# Patient Record
Sex: Female | Born: 1980 | Race: White | Hispanic: No | Marital: Married | Smoking: Never smoker
Health system: Southern US, Community
[De-identification: ages and names within clinical notes are randomized; demographics above are authoritative.]

## PROBLEM LIST (undated history)

## (undated) DIAGNOSIS — K219 Gastro-esophageal reflux disease without esophagitis: Secondary | ICD-10-CM

## (undated) DIAGNOSIS — T7840XA Allergy, unspecified, initial encounter: Secondary | ICD-10-CM

## (undated) DIAGNOSIS — G709 Myoneural disorder, unspecified: Secondary | ICD-10-CM

## (undated) HISTORY — DX: Gastro-esophageal reflux disease without esophagitis: K21.9

## (undated) HISTORY — DX: Myoneural disorder, unspecified: G70.9

## (undated) HISTORY — DX: Allergy, unspecified, initial encounter: T78.40XA

---

## 2013-10-09 HISTORY — PX: NASAL SINUS SURGERY: SHX719

## 2013-10-09 HISTORY — PX: FOOT SURGERY: SHX648

## 2016-02-02 ENCOUNTER — Encounter (HOSPITAL_BASED_OUTPATIENT_CLINIC_OR_DEPARTMENT_OTHER): Payer: Self-pay | Admitting: *Deleted

## 2016-02-02 DIAGNOSIS — Y929 Unspecified place or not applicable: Secondary | ICD-10-CM | POA: Insufficient documentation

## 2016-02-02 DIAGNOSIS — S46912A Strain of unspecified muscle, fascia and tendon at shoulder and upper arm level, left arm, initial encounter: Secondary | ICD-10-CM | POA: Diagnosis not present

## 2016-02-02 DIAGNOSIS — S199XXA Unspecified injury of neck, initial encounter: Secondary | ICD-10-CM | POA: Diagnosis present

## 2016-02-02 DIAGNOSIS — S39012A Strain of muscle, fascia and tendon of lower back, initial encounter: Secondary | ICD-10-CM | POA: Diagnosis not present

## 2016-02-02 DIAGNOSIS — Y999 Unspecified external cause status: Secondary | ICD-10-CM | POA: Insufficient documentation

## 2016-02-02 DIAGNOSIS — S161XXA Strain of muscle, fascia and tendon at neck level, initial encounter: Secondary | ICD-10-CM | POA: Diagnosis not present

## 2016-02-02 DIAGNOSIS — Y939 Activity, unspecified: Secondary | ICD-10-CM | POA: Diagnosis not present
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
## 2016-02-03 ENCOUNTER — Emergency Department (HOSPITAL_BASED_OUTPATIENT_CLINIC_OR_DEPARTMENT_OTHER)
Admission: EM | Admit: 2016-02-03 | Discharge: 2016-02-03 | Disposition: A | Discharge status: Home / Self Care | Payer: Managed Care, Other (non HMO) | Attending: Emergency Medicine | Admitting: Emergency Medicine

## 2016-02-03 DIAGNOSIS — T148XXA Other injury of unspecified body region, initial encounter: Secondary | ICD-10-CM

## 2016-02-03 MED ORDER — KETOROLAC TROMETHAMINE 10 MG PO TABS
10.0000 mg | ORAL_TABLET | Freq: Once | ORAL | Status: DC
  Filled 2016-02-03: qty 1

## 2016-02-03 MED ORDER — DICLOFENAC SODIUM 75 MG PO TBEC: 75.0000 mg | DELAYED_RELEASE_TABLET | Freq: Two times a day (BID) | ORAL | Status: AC

## 2016-02-03 MED ORDER — METHOCARBAMOL 500 MG PO TABS: 500.0000 mg | ORAL_TABLET | Freq: Three times a day (TID) | ORAL | Status: DC

## 2016-02-03 MED ORDER — TRAMADOL HCL 50 MG PO TABS: ORAL_TABLET | ORAL | Status: AC

## 2016-02-03 MED ORDER — ONDANSETRON HCL 8 MG PO TABS
4.0000 mg | ORAL_TABLET | Freq: Once | ORAL | Status: DC
  Filled 2016-02-03: qty 1

## 2016-02-03 MED ORDER — HYDROCODONE-ACETAMINOPHEN 5-325 MG PO TABS
2.0000 | ORAL_TABLET | Freq: Once | ORAL | Status: DC
  Filled 2016-02-03: qty 2

## 2016-02-03 MED ORDER — METHOCARBAMOL 500 MG PO TABS: 500.0000 mg | ORAL_TABLET | Freq: Three times a day (TID) | ORAL | Status: AC

## 2016-02-03 MED ORDER — CYCLOBENZAPRINE HCL 10 MG PO TABS
5.0000 mg | ORAL_TABLET | Freq: Once | ORAL | Status: DC
  Filled 2016-02-03: qty 1

## 2016-02-03 MED ORDER — HYDROCODONE-ACETAMINOPHEN 5-325 MG PO TABS: ORAL_TABLET | ORAL | Status: DC
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
## 2016-02-09 ENCOUNTER — Ambulatory Visit (INDEPENDENT_AMBULATORY_CARE_PROVIDER_SITE_OTHER): Payer: Managed Care, Other (non HMO) | Admitting: Family Medicine

## 2016-02-09 ENCOUNTER — Encounter: Payer: Self-pay | Admitting: Family Medicine

## 2016-02-09 ENCOUNTER — Ambulatory Visit (HOSPITAL_BASED_OUTPATIENT_CLINIC_OR_DEPARTMENT_OTHER)
Admission: RE | Admit: 2016-02-09 | Discharge: 2016-02-09 | Disposition: A | Discharge status: Home / Self Care | Payer: Managed Care, Other (non HMO) | Source: Ambulatory Visit | Attending: Family Medicine | Admitting: Family Medicine

## 2016-02-09 VITALS — BP 120/65 | HR 61 | Temp 98.4°F

## 2016-02-09 DIAGNOSIS — Z87898 Personal history of other specified conditions: Secondary | ICD-10-CM | POA: Diagnosis not present

## 2016-02-09 DIAGNOSIS — R079 Chest pain, unspecified: Secondary | ICD-10-CM | POA: Diagnosis not present

## 2016-02-09 DIAGNOSIS — Z9189 Other specified personal risk factors, not elsewhere classified: Secondary | ICD-10-CM

## 2016-02-09 DIAGNOSIS — R202 Paresthesia of skin: Principal | ICD-10-CM

## 2016-02-09 DIAGNOSIS — M4185 Other forms of scoliosis, thoracolumbar region: Secondary | ICD-10-CM | POA: Insufficient documentation

## 2016-02-09 DIAGNOSIS — R0789 Other chest pain: Secondary | ICD-10-CM

## 2016-02-09 DIAGNOSIS — M542 Cervicalgia: Secondary | ICD-10-CM

## 2016-02-09 DIAGNOSIS — R200 Anesthesia of skin: Secondary | ICD-10-CM | POA: Diagnosis not present

## 2016-02-09 NOTE — Progress Notes (Signed)
Pre visit review using our clinic tool,if applicable. No additional management support is needed unless otherwise documented below in the visit note.  

## 2016-02-09 NOTE — Patient Instructions (Addendum)
We will get some x-rays for you today- please stay on the premises until I have your results!   You may certainly try some tyelnol if you like for pain  I will refer you to an OB/GYN to establish care and also to look into any fertility concerns.  I would also recommend that you husband see urology to make sure that his sperm counts are good.   Your films look good- let me know if your right arm symptoms do not resolve in the next 1-2 weeks. In that case we might consider using an oral steroid for you
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
## 2016-02-11 ENCOUNTER — Encounter: Payer: Self-pay | Admitting: Family Medicine

## 2016-03-02 ENCOUNTER — Encounter: Payer: Managed Care, Other (non HMO) | Admitting: Obstetrics & Gynecology

## 2016-03-02 ENCOUNTER — Encounter: Payer: Self-pay | Admitting: Obstetrics & Gynecology

## 2016-03-02 ENCOUNTER — Ambulatory Visit (INDEPENDENT_AMBULATORY_CARE_PROVIDER_SITE_OTHER): Payer: Managed Care, Other (non HMO) | Admitting: Obstetrics & Gynecology

## 2016-03-02 VITALS — BP 121/77 | HR 73 | Ht 68.0 in | Wt 172.0 lb

## 2016-03-02 DIAGNOSIS — Z1151 Encounter for screening for human papillomavirus (HPV): Secondary | ICD-10-CM

## 2016-03-02 DIAGNOSIS — Z124 Encounter for screening for malignant neoplasm of cervix: Secondary | ICD-10-CM

## 2016-03-02 DIAGNOSIS — N979 Female infertility, unspecified: Secondary | ICD-10-CM

## 2016-03-02 DIAGNOSIS — Z01419 Encounter for gynecological examination (general) (routine) without abnormal findings: Secondary | ICD-10-CM

## 2016-03-02 LAB — TSH: TSH: 1.33 mIU/L

## 2016-03-02 LAB — FOLLICLE STIMULATING HORMONE: FSH: 4.9 m[IU]/mL

## 2016-03-02 LAB — ESTRADIOL: ESTRADIOL: 97 pg/mL
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
## 2016-03-02 NOTE — Patient Instructions (Signed)
Infertility Infertility is when you are unable to get pregnant (conceive) after a year of having sex regularly without using birth control. Infertility can also mean that a woman is not able to carry a pregnancy to full term.  Both women and men can have fertility problems. WHAT CAUSES INFERTILITY? What Causes Infertility in Women? There are many possible causes of infertility in women. For some women, the cause of infertility is not known (unexplained infertility). Infertility can also be linked to more than one cause. Infertility problems in women can be caused by problems with the menstrual cycle or reproductive organs, certain medical conditions, and factors related to lifestyle and age.  Problems with your menstrual cycle can interfere with your ovaries producing eggs (ovulation). This can make it difficult to get pregnant. This includes having a menstrual cycle that is very long, very short, or irregular.  Problems with reproductive organs can include:  An abnormally narrow cervix or a cervix that does not remain closed during a pregnancy.  A blockage in your fallopian tubes.  An abnormally shaped uterus.  Uterine fibroids. This is a tissue mass (tumor) that can develop on your uterus.  Medical conditions that can affect a woman's fertility include:  Polycystic ovarian syndrome (PCOS). This is a hormonal disorder that can cause small cysts to grow on your ovaries. This is the most common cause of infertility in women.  Endometriosis. This is a condition in which the tissue that lines your uterus (endometrium) grows outside of its normal location.  Primary ovary insufficiency. This is when your ovaries stop producing eggs and hormones before the age of 40.  Sexually transmitted diseases, such as chlamydia or gonorrhea. These infections can cause scarring in your fallopian tubes. This makes it difficult for eggs to reach your uterus.  Autoimmune disorders. These are disorders in  which your immune system attacks normal, healthy cells.  Hormone imbalances.  Other factors include:  Age. A woman's fertility declines with age, especially after her mid-30s.  Being under- or overweight.  Drinking too much alcohol.  Using drugs.  Exercising excessively.  Being exposed to environmental toxins, such as radiation, pesticides, and certain chemicals. What Causes Infertility in Men? There are many causes of infertility in men. Infertility can be linked to more than one cause. Infertility problems in men can be caused by problems with sperm or the reproductive organs, certain medical conditions, and factors related to lifestyle and age. Some men have unexplained infertility.   Problems with sperm. Infertility can result if there is a problem producing:  Enough sperm (low sperm count).  Enough normally-shaped sperm (sperm morphology).  Sperm that are able to reach the egg (poor motility).  Infertility can also be caused by:  A problem with hormones.  Enlarged veins (varicoceles), cysts (spermatoceles), or tumors of the testicles.  Sexual dysfunction.  Injury to the testicles.  A birth defect, such as not having the tubes that carry sperm (vas deferens).  Medical conditions that can affect a man's fertility include:  Diabetes.  Cancer treatments, such as chemotherapy or radiation.  Klinefelter syndrome. This is an inherited genetic disorder.   Thyroid problems, such as an under- or overactive thyroid.  Cystic fibrosis.  Sexually transmitted diseases.  Other factors include:  Age. A man's fertility declines with age.  Drinking too much alcohol.  Using drugs.  Being exposed to environmental toxins, such as pesticides and lead. WHAT ARE THE SYMPTOMS OF INFERTILITY? Being unable to get pregnant after one year of having   regular sex without using birth control is the only sign of infertility.  HOW IS INFERTILITY DIAGNOSED? In order to be diagnosed  with infertility, both partners will have a physical exam. Both partners will also have an extensive medical and sexual history taken. If there is no obvious reason for infertility, additional tests may be done. What Tests Will Women Have? Women may first have tests to check whether they are ovulating each month. The tests may include:  Blood tests to check hormone levels.  An ultrasound of the ovaries. This looks for possible problems on or in the ovaries.  Taking a small sample of the tissue that lines the uterus for examination under a microscope (endometrial biopsy). Women who are ovulating may have additional tests. These may include:  Hysterosalpingography.  This is an X-ray of the fallopian tubes and uterus taken after a specific type of dye is injected.  This test can show the shape of the uterus and whether the fallopian tubes are open.  Laparoscopy.  In this test, a lighted tube (laparoscope) is used to look for problems in the fallopian tubes and other female organs.  Transvaginal ultrasound.  This is an imaging test to check for abnormalities of the uterus and ovaries.  A health care provider can use this test to count the number of follicles on the ovaries.  Hysteroscopy.  This test involves using a lighted tube to examine the cervix and inside the uterus.  It is done to find any abnormalities inside the uterus. What Tests Will Men Have? Tests for men's infertility includes:  Semen tests to check sperm count, morphology, and motility.  Blood tests to check for hormone levels.  Taking a small sample of tissue from inside a testicle (biopsy). This is examined under a microscope.  Blood tests to check for genetic abnormalities (genetic testing). HOW ARE WOMEN TREATED FOR INFERTILITY?  Treatment depends on the cause of infertility. Most cases of infertility in women are treated with medicine or surgery.  Women may take medicine to:  Correct ovulation  problems.  Treat other health conditions, such as PCOS.  Surgery may be done to:  Repair damage to the ovaries, fallopian tubes, cervix, or uterus.  Remove growths from the uterus.  Remove scar tissue from the uterus, pelvis, or other female organs. HOW ARE MEN TREATED FOR INFERTILITY?  Treatment depends on the cause of infertility. Most cases of infertility in men are treated with medicine or surgery.   Men may take medicine to:  Correct hormone problems.  Treat other health conditions.  Treat sexual dysfunction.  Surgery may be done to:  Remove blockages in the reproductive tract.  Correct other structural problems of the reproductive tract. WHAT IS ASSISTED REPRODUCTIVE TECHNOLOGY? Assisted reproductive technology (ART) refers to all treatments and procedures that combine eggs and sperm outside the body to try to help a couple conceive. ART is often combined with fertility drugs to stimulate ovulation. Sometimes ART is done using eggs retrieved from another woman's body (donor eggs) or from previously frozen fertilized eggs (embryos).  There are different types of ART. These include:   Intrauterine insemination (IUI).  In this procedure, sperm is placed directly into a woman's uterus with a long, thin tube.  This may be most effective for infertility caused by sperm problems, including low sperm count and low motility.  Can be used in combination with fertility drugs.  In vitro fertilization (IVF).  This is often done when a woman's fallopian tubes are blocked   or when a man has low sperm counts.  Fertility drugs stimulate the ovaries to produce multiple eggs. Once mature, these eggs are removed from the body and combined with the sperm to be fertilized.  These fertilized eggs are then placed in the woman's uterus.   This information is not intended to replace advice given to you by your health care provider. Make sure you discuss any questions you have with your  health care provider.   Document Released: 09/28/2003 Document Revised: 06/16/2015 Document Reviewed: 06/10/2014 [employer] Interactive Patient Education 2016 [employer]  

## 2016-03-06 LAB — ANTI MULLERIAN HORMONE: AMH AssessR: 0.19 ng/mL

## 2016-03-08 LAB — CYTOLOGY - PAP

## 2016-03-16 ENCOUNTER — Telehealth: Payer: Self-pay

## 2016-03-16 NOTE — Telephone Encounter (Signed)
Called and discussed patient lab results at length with the patient. Patient made aware the next step is for her husband to complete a semen analysis. Patient states she has not discussed it with her husband yet and will call back to get information on it once they have discuss it. Also discuss things that he could do like- wearing loose fitting underwear and reducing stressors as much as possible. Patient states understanding. Armandina StammerJennifer Howard RN BSN

## 2016-03-22 ENCOUNTER — Ambulatory Visit: Payer: Managed Care, Other (non HMO) | Admitting: Obstetrics & Gynecology

## 2016-06-08 ENCOUNTER — Telehealth: Payer: Self-pay

## 2016-06-08 NOTE — Telephone Encounter (Signed)
Patient called stating that she got a bill from [facility] for 400+ for labwork that was collected on 03-02-16.  Patient upset because she states that the doctor stated that these labs would be cover under her well woman exam.  Patient made aware I will call the [facility] lab representative to see if they will run the labwork with the diagnosis code of only well woman exam since these labs are things covered under well woman exam. Melissa with [facility] stated she would call billing and as them to re run charges. Armandina StammerJennifer Walfred Bettendorf RNBSN

## 2016-06-28 ENCOUNTER — Telehealth: Payer: Self-pay

## 2016-06-28 NOTE — Telephone Encounter (Signed)
error 
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
